# Patient Record
Sex: Male | Born: 1990 | Hispanic: Yes | Marital: Single | Smoking: Never smoker
Health system: Southern US, Community
[De-identification: ages and names within clinical notes are randomized; demographics above are authoritative.]

---

## 2020-08-31 ENCOUNTER — Inpatient Hospital Stay (HOSPITAL_BASED_OUTPATIENT_CLINIC_OR_DEPARTMENT_OTHER)
Admission: EM | Admit: 2020-08-31 | Discharge: 2020-09-02 | DRG: 155 | Disposition: A | Discharge status: Home / Self Care | Payer: No Typology Code available for payment source | Attending: Internal Medicine | Admitting: Internal Medicine

## 2020-08-31 ENCOUNTER — Other Ambulatory Visit: Payer: Self-pay

## 2020-08-31 ENCOUNTER — Emergency Department (HOSPITAL_BASED_OUTPATIENT_CLINIC_OR_DEPARTMENT_OTHER): Payer: No Typology Code available for payment source

## 2020-08-31 ENCOUNTER — Encounter (HOSPITAL_BASED_OUTPATIENT_CLINIC_OR_DEPARTMENT_OTHER): Payer: Self-pay

## 2020-08-31 DIAGNOSIS — R739 Hyperglycemia, unspecified: Secondary | ICD-10-CM | POA: Diagnosis present

## 2020-08-31 DIAGNOSIS — J384 Edema of larynx: Secondary | ICD-10-CM | POA: Diagnosis present

## 2020-08-31 DIAGNOSIS — Z20822 Contact with and (suspected) exposure to covid-19: Secondary | ICD-10-CM | POA: Diagnosis not present

## 2020-08-31 DIAGNOSIS — K7460 Unspecified cirrhosis of liver: Secondary | ICD-10-CM | POA: Diagnosis not present

## 2020-08-31 DIAGNOSIS — E872 Acidosis: Secondary | ICD-10-CM | POA: Diagnosis present

## 2020-08-31 DIAGNOSIS — N179 Acute kidney failure, unspecified: Secondary | ICD-10-CM | POA: Diagnosis present

## 2020-08-31 DIAGNOSIS — R7989 Other specified abnormal findings of blood chemistry: Secondary | ICD-10-CM

## 2020-08-31 DIAGNOSIS — F17210 Nicotine dependence, cigarettes, uncomplicated: Secondary | ICD-10-CM | POA: Diagnosis not present

## 2020-08-31 LAB — COMPREHENSIVE METABOLIC PANEL
ALT: 193 U/L — ABNORMAL HIGH (ref 0–44)
ALT: 143 U/L — ABNORMAL HIGH (ref 0–44)
AST: 116 U/L — ABNORMAL HIGH (ref 15–41)
AST: 74 U/L — ABNORMAL HIGH (ref 15–41)
Albumin: 5.7 g/dL — ABNORMAL HIGH (ref 3.5–5.0)
Albumin: 4.7 g/dL (ref 3.5–5.0)
Alkaline Phosphatase: 92 U/L (ref 38–126)
Alkaline Phosphatase: 71 U/L (ref 38–126)
Anion gap: 27 — ABNORMAL HIGH (ref 5–15)
Anion gap: 14 (ref 5–15)
BUN: 26 mg/dL — ABNORMAL HIGH (ref 6–20)
BUN: 21 mg/dL — ABNORMAL HIGH (ref 6–20)
CO2: 17 mmol/L — ABNORMAL LOW (ref 22–32)
CO2: 27 mmol/L (ref 22–32)
Calcium: 10.2 mg/dL (ref 8.9–10.3)
Calcium: 9.2 mg/dL (ref 8.9–10.3)
Chloride: 92 mmol/L — ABNORMAL LOW (ref 98–111)
Chloride: 97 mmol/L — ABNORMAL LOW (ref 98–111)
Creatinine, Ser: 1.26 mg/dL — ABNORMAL HIGH (ref 0.61–1.24)
Creatinine, Ser: 0.97 mg/dL (ref 0.61–1.24)
GFR, Estimated: >60 mL/min (ref >60)
GFR, Estimated: >60 mL/min (ref >60)
Glucose, Bld: 152 mg/dL — ABNORMAL HIGH (ref 70–99)
Glucose, Bld: 105 mg/dL — ABNORMAL HIGH (ref 70–99)
Potassium: 3.8 mmol/L (ref 3.5–5.1)
Potassium: 4.3 mmol/L (ref 3.5–5.1)
Sodium: 138 mmol/L (ref 135–145)
Sodium: 138 mmol/L (ref 135–145)
Total Bilirubin: 1.7 mg/dL — ABNORMAL HIGH (ref 0.3–1.2)
Total Bilirubin: 1.2 mg/dL (ref 0.3–1.2)
Total Protein: 9.2 g/dL — ABNORMAL HIGH (ref 6.5–8.1)
Total Protein: 7.6 g/dL (ref 6.5–8.1)

## 2020-08-31 LAB — I-STAT VENOUS BLOOD GAS, ED
Acid-Base Excess: 2 mmol/L (ref 0.0–2.0)
Bicarbonate: 26.2 mmol/L (ref 20.0–28.0)
Calcium, Ion: 1.17 mmol/L (ref 1.15–1.40)
HCT: 46 % (ref 39.0–52.0)
Hemoglobin: 15.6 g/dL (ref 13.0–17.0)
O2 Saturation: 81 %
Patient temperature: 98.6
Potassium: 5.1 mmol/L (ref 3.5–5.1)
Sodium: 132 mmol/L — ABNORMAL LOW (ref 135–145)
TCO2: 27 mmol/L (ref 22–32)
pCO2, Ven: 37.6 mmHg — ABNORMAL LOW (ref 44.0–60.0)
pH, Ven: 7.451 — ABNORMAL HIGH (ref 7.250–7.430)
pO2, Ven: 43 mmHg (ref 32.0–45.0)

## 2020-08-31 LAB — BETA-HYDROXYBUTYRIC ACID: Beta-Hydroxybutyric Acid: 2.64 mmol/L — ABNORMAL HIGH (ref 0.05–0.27)

## 2020-08-31 LAB — URINALYSIS, ROUTINE W REFLEX MICROSCOPIC
Bilirubin Urine: NEGATIVE
Glucose, UA: NEGATIVE mg/dL
Hgb urine dipstick: NEGATIVE
Ketones, ur: >80 mg/dL — AB
Leukocytes,Ua: NEGATIVE
Nitrite: NEGATIVE
Protein, ur: 100 mg/dL — AB
Specific Gravity, Urine: 1.03 (ref 1.005–1.030)
pH: 5.5 (ref 5.0–8.0)

## 2020-08-31 LAB — CBC
HCT: 49.9 % (ref 39.0–52.0)
Hemoglobin: 17.5 g/dL — ABNORMAL HIGH (ref 13.0–17.0)
MCH: 32.5 pg (ref 26.0–34.0)
MCHC: 35.1 g/dL (ref 30.0–36.0)
MCV: 92.8 fL (ref 80.0–100.0)
Platelets: 378 10*3/uL (ref 150–400)
RBC: 5.38 MIL/uL (ref 4.22–5.81)
RDW: 13.8 % (ref 11.5–15.5)
WBC: 16.1 10*3/uL — ABNORMAL HIGH (ref 4.0–10.5)
nRBC: 0 % (ref 0.0–0.2)

## 2020-08-31 LAB — RAPID URINE DRUG SCREEN, HOSP PERFORMED
Amphetamines: POSITIVE — AB
Barbiturates: NOT DETECTED
Benzodiazepines: NOT DETECTED
Cocaine: NOT DETECTED
Opiates: POSITIVE — AB
Tetrahydrocannabinol: POSITIVE — AB

## 2020-08-31 LAB — BASIC METABOLIC PANEL
Anion gap: 18 — ABNORMAL HIGH (ref 5–15)
Anion gap: 13 (ref 5–15)
BUN: 25 mg/dL — ABNORMAL HIGH (ref 6–20)
BUN: 15 mg/dL (ref 6–20)
CO2: 20 mmol/L — ABNORMAL LOW (ref 22–32)
CO2: 24 mmol/L (ref 22–32)
Calcium: 9.3 mg/dL (ref 8.9–10.3)
Calcium: 9.5 mg/dL (ref 8.9–10.3)
Chloride: 95 mmol/L — ABNORMAL LOW (ref 98–111)
Chloride: 99 mmol/L (ref 98–111)
Creatinine, Ser: 1.14 mg/dL (ref 0.61–1.24)
Creatinine, Ser: 1.07 mg/dL (ref 0.61–1.24)
GFR, Estimated: >60 mL/min (ref >60)
GFR, Estimated: >60 mL/min (ref >60)
Glucose, Bld: 266 mg/dL — ABNORMAL HIGH (ref 70–99)
Glucose, Bld: 127 mg/dL — ABNORMAL HIGH (ref 70–99)
Potassium: 5.4 mmol/L — ABNORMAL HIGH (ref 3.5–5.1)
Potassium: 4.3 mmol/L (ref 3.5–5.1)
Sodium: 133 mmol/L — ABNORMAL LOW (ref 135–145)
Sodium: 136 mmol/L (ref 135–145)

## 2020-08-31 LAB — HEMOGLOBIN A1C
Hgb A1c MFr Bld: 5.1 % (ref 4.8–5.6)
Mean Plasma Glucose: 99.67 mg/dL

## 2020-08-31 LAB — HIV ANTIBODY (ROUTINE TESTING W REFLEX): HIV Screen 4th Generation wRfx: NONREACTIVE

## 2020-08-31 LAB — CK: Total CK: 243 U/L (ref 49–397)

## 2020-08-31 LAB — RESP PANEL BY RT-PCR (FLU A&B, COVID) ARPGX2
Influenza A by PCR: NEGATIVE
Influenza B by PCR: NEGATIVE
SARS Coronavirus 2 by RT PCR: NEGATIVE

## 2020-08-31 LAB — SALICYLATE LEVEL: Salicylate Lvl: <7 mg/dL — ABNORMAL LOW (ref 7.0–30.0)

## 2020-08-31 LAB — LACTIC ACID, PLASMA: Lactic Acid, Venous: 1 mmol/L (ref 0.5–1.9)

## 2020-08-31 LAB — LIPASE, BLOOD: Lipase: <10 U/L — ABNORMAL LOW (ref 11–51)

## 2020-08-31 MED ORDER — ONDANSETRON HCL 4 MG/2ML IJ SOLN
4.0000 mg | Freq: Once | INTRAMUSCULAR | Status: AC
  Administered 2020-08-31: 4 mg via INTRAVENOUS
  Filled 2020-08-31: qty 2

## 2020-08-31 MED ORDER — SUCRALFATE 1 GM/10ML PO SUSP
1.0000 g | Freq: Once | ORAL | Status: AC
  Administered 2020-08-31: 1 g via ORAL
  Filled 2020-08-31: qty 10

## 2020-08-31 MED ORDER — DEXTROSE IN LACTATED RINGERS 5 % IV SOLN: INTRAVENOUS | Status: DC

## 2020-08-31 MED ORDER — LIDOCAINE VISCOUS HCL 2 % MT SOLN
15.0000 mL | Freq: Once | OROMUCOSAL | Status: AC
  Administered 2020-08-31: 15 mL via OROMUCOSAL
  Filled 2020-08-31: qty 15

## 2020-08-31 MED ORDER — LACTATED RINGERS IV BOLUS
1000.0000 mL | Freq: Once | INTRAVENOUS | Status: AC
  Administered 2020-08-31: 1000 mL via INTRAVENOUS

## 2020-08-31 MED ORDER — DEXAMETHASONE SODIUM PHOSPHATE 10 MG/ML IJ SOLN
16.0000 mg | Freq: Once | INTRAMUSCULAR | Status: AC
  Administered 2020-08-31 (×2): 16 mg via INTRAVENOUS
  Filled 2020-08-31: qty 2

## 2020-08-31 MED ORDER — FAMOTIDINE IN NACL 20-0.9 MG/50ML-% IV SOLN
20.0000 mg | Freq: Once | INTRAVENOUS | Status: AC
  Administered 2020-08-31: 20 mg via INTRAVENOUS
  Filled 2020-08-31: qty 50

## 2020-08-31 MED ORDER — LACTATED RINGERS IV BOLUS: 2000.0000 mL | Freq: Once | INTRAVENOUS | Status: DC

## 2020-08-31 MED ORDER — LACTATED RINGERS IV SOLN: INTRAVENOUS | Status: AC

## 2020-08-31 MED ORDER — EPINEPHRINE 0.3 MG/0.3ML IJ SOAJ
0.3000 mg | Freq: Once | INTRAMUSCULAR | Status: AC
  Administered 2020-08-31: 0.3 mg via INTRAMUSCULAR
  Filled 2020-08-31: qty 0.3

## 2020-08-31 MED ORDER — MORPHINE SULFATE (PF) 4 MG/ML IV SOLN
4.0000 mg | Freq: Once | INTRAVENOUS | Status: AC
  Administered 2020-08-31: 4 mg via INTRAVENOUS
  Filled 2020-08-31: qty 1

## 2020-08-31 NOTE — ED Notes (Signed)
Patient transported to CT 

## 2020-08-31 NOTE — ED Triage Notes (Signed)
He reports vomiting several times while at work yesterday. 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 He tells me he has vomited about twenty times during the night last night. He is in no distress.

## 2020-08-31 NOTE — H&P (Signed)
History and Physical    Eugene Swanson YWV:[id number] DOB: 10-01-1990 DOA: 08/31/2020  PCP: Eartha Inch, MD  Patient coming from: Home.  Chief Complaint: Nausea vomiting and throat pain.  HPI: Eugene Swanson is a 30 y.o. male with no significant past medical history was brought to the ER after patient had multiple episodes of nausea vomiting and throat pain.  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  He felt place was very hard and he started perspiring and eventually started having multiple episodes of vomiting at least 3 episodes.  Since he was not feeling well he was allowed to go early.  After reaching home he was feeling fine but later in the night he started having multiple episodes throwing up eventually some blood-tinged.  He threw up overnight until morning 5:00.  At this point he decided come to the ER.  His throat started hurting and he also had having pain when he swallows anything.  Denies any fever chills chest pain shortness of breath abdominal pain.  Denies taking any medications.  ED Course: In the ER patient was afebrile.  Labs were significant for elevated LFTs with AST of 116 ALT of 193 total bilirubin of 1.7 creatinine 1.2 anion gap of 27 with bicarb of 17 blood glucose 152 hemoglobin A1c was 5.1 urine shows a lot of ketones CBC is showing WBC of 16.1 lactic acid was normal salicylates were negative patient's anion gap was corrected after fluids.  Given the throat pain patient had CT soft neck tissue done which shows bilateral laryngeal edema with esophagitis.  Patient was given steroids Pepcid admitted for further observation.  COVID test was negative.  Review of Systems: As per HPI, rest all negative.   Past medical history -nothing significant.  Past surgical history -none.  Social history -smokes cigarettes and drinks alcohol occasionally.  Denies using drugs.  No  Known Allergies  Family history -denies any diabetes in the family.  Prior to Admission medications   Not on File    Physical Exam: Constitutional: Moderately built and nourished. Vitals:   08/31/20 1633 08/31/20 1700 08/31/20 1800 08/31/20 2128  BP: (!) 169/94 (!) 154/104 (!) 143/93 (!) 154/88  Pulse: 94 64 89 81  Resp: 16 11 13 20   Temp:    97.9 F (36.6 C)  TempSrc:    Oral  SpO2: 100% 100% 100% 100%   Eyes: Anicteric no pallor. ENMT: No discharge from the ears eyes nose or mouth. Neck: No stridor. Respiratory: No rhonchi or crepitations. Cardiovascular: S1-S2 heard. Abdomen: Soft nontender bowel sounds present. Musculoskeletal: No edema. Skin: No rash. Neurologic: Alert awake oriented time place and person.  Moves all extremities. Psychiatric: Appears normal.  Normal affect.   Labs on Admission: I have personally reviewed following labs and imaging studies  CBC: Recent Labs  Lab 08/31/20 0927 08/31/20 1246  WBC 16.1*  --   HGB 17.5* 15.6  HCT 49.9 46.0  MCV 92.8  --   PLT 378  --    Basic Metabolic Panel: Recent Labs  Lab 08/31/20 0927 08/31/20 1246 08/31/20 1254 08/31/20 1545  NA 138 132* 133* 138  K 3.8 5.1 5.4* 4.3  CL 92*  --  95* 97*  CO2 17*  --  20* 27  GLUCOSE 152*  --  266* 105*  BUN 26*  --  25* 21*  CREATININE 1.26*  --  1.14 0.97  CALCIUM 10.2  --  9.3 9.2   GFR: CrCl cannot be calculated (Unknown ideal weight.). Liver Function Tests: Recent Labs  Lab 08/31/20 0927 08/31/20 1545  AST 116* 74*  ALT 193* 143*  ALKPHOS 92 71  BILITOT 1.7* 1.2  PROT 9.2* 7.6  ALBUMIN 5.7* 4.7   Recent Labs  Lab 08/31/20 0927  LIPASE <10*   No results for input(s): AMMONIA in the last 168 hours. Coagulation Profile: No results for input(s): INR, PROTIME in the last 168 hours. Cardiac Enzymes: Recent Labs  Lab 08/31/20 1417  CKTOTAL 243   BNP (last 3 results) No results for input(s): PROBNP in the last 8760 hours. HbA1C: No results  for input(s): HGBA1C in the last 72 hours. CBG: No results for input(s): GLUCAP in the last 168 hours. Lipid Profile: No results for input(s): CHOL, HDL, LDLCALC, TRIG, CHOLHDL, LDLDIRECT in the last 72 hours. Thyroid Function Tests: No results for input(s): TSH, T4TOTAL, FREET4, T3FREE, THYROIDAB in the last 72 hours. Anemia Panel: No results for input(s): VITAMINB12, FOLATE, FERRITIN, TIBC, IRON, RETICCTPCT in the last 72 hours. Urine analysis:    Component Value Date/Time   COLORURINE YELLOW 08/31/2020 1022   APPEARANCEUR CLEAR 08/31/2020 1022   LABSPEC 1.030 08/31/2020 1022   PHURINE 5.5 08/31/2020 1022   GLUCOSEU NEGATIVE 08/31/2020 1022   HGBUR NEGATIVE 08/31/2020 1022   BILIRUBINUR NEGATIVE 08/31/2020 1022   KETONESUR >80 (A) 08/31/2020 1022   PROTEINUR 100 (A) 08/31/2020 1022   NITRITE NEGATIVE 08/31/2020 1022   LEUKOCYTESUR NEGATIVE 08/31/2020 1022   Sepsis Labs: @LABRCNTIP (procalcitonin:4,lacticidven:4) ) Recent Results (from the past 240 hour(s))  Resp Panel by RT-PCR (Flu A&B, Covid) Nasopharyngeal Swab     Status: None   Collection Time: 08/31/20  7:26 PM   Specimen: Nasopharyngeal Swab; Nasopharyngeal(NP) swabs in vial transport medium  Result Value Ref Range Status   SARS Coronavirus 2 by RT PCR NEGATIVE NEGATIVE Final    Comment: (NOTE) SARS-CoV-2 target nucleic acids are NOT DETECTED.  The SARS-CoV-2 RNA is generally detectable in upper respiratory specimens during the acute phase of infection. The lowest concentration of SARS-CoV-2 viral copies this assay can detect is 138 copies/mL. A negative result does not preclude SARS-Cov-2 infection and should not be used as the sole basis for treatment or other patient management decisions. A negative result may occur with  improper specimen collection/handling, submission of specimen other than nasopharyngeal swab, presence of viral mutation(s) within the areas targeted by this assay, and inadequate number of  viral copies(<138 copies/mL). A negative result must be combined with clinical observations, patient history, and epidemiological information. The expected result is Negative.  Fact Sheet for Patients:  09/02/20  Fact Sheet for Healthcare Providers:  BloggerCourse.com  This test is no t yet approved or cleared by the [place] FDA and  has been authorized for detection and/or diagnosis of SARS-CoV-2 by FDA under an Emergency Use Authorization (EUA). This EUA will remain  in effect (meaning this test can be used) for the duration of the COVID-19 declaration under Section 564(b)(1) of the Act, 21 U.S.C.section 360bbb-3(b)(1), unless the authorization is terminated  or revoked sooner.       Influenza A by PCR NEGATIVE NEGATIVE Final   Influenza B by PCR NEGATIVE NEGATIVE Final    Comment: (NOTE) The Xpert Xpress SARS-CoV-2/FLU/RSV plus assay is intended as an aid in the diagnosis of influenza from Nasopharyngeal swab specimens and should not be used as a sole basis for treatment. Nasal washings and  aspirates are unacceptable for Xpert Xpress SARS-CoV-2/FLU/RSV testing.  Fact Sheet for Patients: BloggerCourse.com  Fact Sheet for Healthcare Providers: SeriousBroker.it  This test is not yet approved or cleared by the [place] FDA and has been authorized for detection and/or diagnosis of SARS-CoV-2 by FDA under an Emergency Use Authorization (EUA). This EUA will remain in effect (meaning this test can be used) for the duration of the COVID-19 declaration under Section 564(b)(1) of the Act, 21 U.S.C. section 360bbb-3(b)(1), unless the authorization is terminated or revoked.  Performed at [facility], [address], [place], [place] [address]      Radiological Exams on Admission: CT Soft Tissue Neck Wo Contrast  Result Date:  08/31/2020 CLINICAL DATA:  Dysphagia.  Propane, vomiting, EXAM: CT NECK WITHOUT CONTRAST TECHNIQUE: Multidetector CT imaging of the neck was performed following the standard protocol without intravenous contrast. COMPARISON:  None. FINDINGS: Pharynx and larynx: Oropharynx and nasopharynx normal. There is symmetric edema of the larynx bilaterally. This may be causing airway narrowing. No mass lesion is seen. The proximal esophagus is partially visualized and appears thickened. Salivary glands: No inflammation, mass, or stone. Thyroid: Negative Lymph nodes: No enlarged lymph nodes in the neck. Vascular: Limited vascular evaluation without intravenous contrast. Limited intracranial: Negative Visualized orbits: Negative Mastoids and visualized paranasal sinuses: Paranasal sinuses clear. Mastoid clear bilaterally. Skeleton: Negative Upper chest: Lung apices clear bilaterally. Other: None IMPRESSION: Bilateral laryngeal edema. Question anaphylaxis or allergic reaction. Evaluate for airway clinically. Question thickening of the esophagus which could represent esophagitis. These results were called by telephone at the time of interpretation on 08/31/2020 at 4:19 pm to provider Schlossman, who verbally acknowledged these results. Electronically Signed   By: Marlan Palau M.D.   On: 08/31/2020 16:20      Assessment/Plan Principal Problem:   Laryngeal edema    1. Bilateral laryngeal edema cause not clear.  Concerning for either allergic reaction or anaphylaxis.  At this time we will continue with Solu-Medrol and Pepcid.  Keep n.p.o. consult ENT. 2. Possible esophagitis seen in the CT scan.  On Pepcid at this time.  NPO. 3. Intractable nausea vomiting cause not clear since patient has elevated LFTs will check acute hepatitis panel.  Abdomen appears benign.  Check urine drug screen.  We will keep patient n.p.o. for now and on IV Pepcid for now. 4. High anion gap metabolic acidosis likely from intractable vomiting  which has improved with fluids.  We will continue to monitor metabolic panel. 5. Elevated LFTs cause not clear.  Will follow LFTs check INR check acute hepatitis panel.  Tylenol level.   DVT prophylaxis: SCDs.  Avoiding anticoagulation in anticipation of possible procedure. Code Status: Full code. Family Communication: Discussed with patient. Disposition Plan: Home. Consults called: None. Admission status: Observation.   Eduard Clos MD [facility] Pager [phone].  If 7PM-7AM, please contact night-coverage [email or web address] Password [facility]  08/31/2020, 10:09 PM

## 2020-08-31 NOTE — ED Provider Notes (Signed)
  Physical Exam  BP (!) 143/93   Pulse 89   Temp 98 F (36.7 C) (Oral)   Resp 13   SpO2 100%   Physical Exam  ED Course/Procedures     Procedures  MDM  Received care of patient from Dr. Rhunette Croft.  Please see his note for prior history, physical and care.  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  No continued severe vomiting.  On initial presentation to the emergency department he had an anion gap metabolic acidosis, which is suspected to be secondary to starvation ketoacidosis.  On recheck it was improving, however he was noted to have hyperglycemia.  Considered in the differential new onset diabetes, but suspect more likely starvation ketosis.  He had severe throat pain after vomiting, and had a noncontrast neck CT which was performed to evaluate for other causes of sore throat.  CT does not show signs of abscess, subcu air, but is significant for laryngeal edema.  He does not have stridor or signs of airway compromise on exam.  Given concern for this airway edema plus nausea and vomiting, considered atypical type of presentation of anaphylaxis.  Given 0.3 mg of epi and gave Decadron.  Reports mild improvement but continued symptoms following this.  Will admit to the hospital for continued observation.  Possible that the laryngeal edema is due to chemical exposure at work, or inflammation related to straining from vomiting.       Alvira Monday, MD 09/02/20 [phone]

## 2020-08-31 NOTE — ED Provider Notes (Signed)
[facility] EMERGENCY DEPT Provider Note   CSN: [id number] Arrival date & time: 08/31/20  0865     History Chief Complaint  Patient presents with  . Emesis    Eugene Swanson is a 30 y.o. male.  HPI     30 year old male comes in with chief complaint of vomiting.  He has no medical history and denies any heavy alcohol use, smoking, drug use.  Patient reports that he went to work yesterday.  His work requires him to be in a hot environment, fully suited.  Halfway through the work he started having nausea.  He threw up while at work, return to the work after cooling off but started feeling same way again.  He decided to go home earlier and at home he has thrown up at least 20 times overnight.  He is having severe pain with swallowing.  He is also reporting blood-tinged vomitus at the end.  He does not have any abdominal pain.   No past medical history on file.  There are no problems to display for this patient.       No family history on file.  Social History   Tobacco Use  . Smoking status: Never Smoker  . Smokeless tobacco: Never Used    Home Medications Prior to Admission medications   Not on File    Allergies    Patient has no known allergies.  Review of Systems   Review of Systems  Constitutional: Positive for activity change.  Gastrointestinal: Positive for nausea and vomiting.  All other systems reviewed and are negative.   Physical Exam Updated Vital Signs BP (!) 174/102 (BP Location: Left Arm)   Pulse (!) 111   Temp 98 F (36.7 C) (Oral)   Resp 12   SpO2 100%   Physical Exam Vitals and nursing note reviewed.  Constitutional:      Appearance: He is well-developed.  HENT:     Head: Atraumatic.     Mouth/Throat:     Mouth: Mucous membranes are dry.  Cardiovascular:     Rate and Rhythm: Tachycardia present.  Pulmonary:     Effort: Pulmonary effort is normal.  Musculoskeletal:     Cervical back: Neck supple.  Skin:     General: Skin is warm.  Neurological:     Mental Status: He is alert and oriented to person, place, and time.     ED Results / Procedures / Treatments   Labs (all labs ordered are listed, but only abnormal results are displayed) Labs Reviewed  LIPASE, BLOOD - Abnormal; Notable for the following components:      Result Value   Lipase <10 (*)    All other components within normal limits  COMPREHENSIVE METABOLIC PANEL - Abnormal; Notable for the following components:   Chloride 92 (*)    CO2 17 (*)    Glucose, Bld 152 (*)    BUN 26 (*)    Creatinine, Ser 1.26 (*)    Total Protein 9.2 (*)    Albumin 5.7 (*)    AST 116 (*)    ALT 193 (*)    Total Bilirubin 1.7 (*)    Anion gap 27 (*)    All other components within normal limits  CBC - Abnormal; Notable for the following components:   WBC 16.1 (*)    Hemoglobin 17.5 (*)    All other components within normal limits  URINALYSIS, ROUTINE W REFLEX MICROSCOPIC - Abnormal; Notable for the following components:   Ketones,  ur >80 (*)    Protein, ur 100 (*)    All other components within normal limits  I-STAT VENOUS BLOOD GAS, ED - Abnormal; Notable for the following components:   pH, Ven 7.451 (*)    pCO2, Ven 37.6 (*)    Sodium 132 (*)    All other components within normal limits  BASIC METABOLIC PANEL    EKG None  Radiology No results found.  Procedures .Critical Care Performed by: Derwood Kaplan, MD Authorized by: Derwood Kaplan, MD   Critical care provider statement:    Critical care time (minutes):  50   Critical care was necessary to treat or prevent imminent or life-threatening deterioration of the following conditions:  Metabolic crisis and dehydration   Critical care was time spent personally by me on the following activities:  Discussions with consultants, evaluation of patient's response to treatment, examination of patient, ordering and performing treatments and interventions, ordering and review of  laboratory studies, ordering and review of radiographic studies, pulse oximetry, re-evaluation of patient's condition, obtaining history from patient or surrogate and review of old charts     Medications Ordered in ED Medications  dextrose 5 % in lactated ringers infusion ( Intravenous New Bag/Given 08/31/20 1107)  ondansetron (ZOFRAN) injection 4 mg (4 mg Intravenous Given 08/31/20 1115)  lactated ringers bolus 1,000 mL (1,000 mLs Intravenous New Bag/Given 08/31/20 1244)  sucralfate (CARAFATE) 1 GM/10ML suspension 1 g (1 g Oral Given 08/31/20 1115)  lidocaine (XYLOCAINE) 2 % viscous mouth solution 15 mL (15 mLs Mouth/Throat Given 08/31/20 1257)    ED Course  I have reviewed the triage vital signs and the nursing notes.  Pertinent labs & imaging results that were available during my care of the patient were reviewed by me and considered in my medical decision making (see chart for details).    MDM Rules/Calculators/A&P                          30 year old male comes in with chief complaint of nausea, vomiting, dizziness.  He is also complaining of throat pain.  Initial thought was that patient might have had heat exhaustion and dehydration. Basic labs ordered, it reveals severe anion gap of 27 slight elevation of creatinine.  Patient was given 1 L of LR and 1 L of D5LR -with the thought that he might have starvation ketosis along with dehydration.  Repeat labs revealed improved anion gap.  Patient still has a mild anion gap and he has now elevated sugar.  With that lab, the thought was that maybe patient could have new onset diabetes.  He is not able to provide any clear history whether he is having polyuria, polyphagia, polydipsia.  Plan is to give another liter of LR and reassess his CMP, get beta hydroxybutyrate acid, and continue with oral challenge.  So far patient has received GI cocktail, IV morphine and he still has severe pain in his throat.  It still makes it difficult for him to  swallow.  At 330, patient's care will be signed out to incoming team.  If patient's repeat labs are not significantly better, then he will need admission to the hospital for evaluation of diabetes and treatment for his ketosis.  If patient labs are stable or improving, but he is unable to swallow and passed oral challenge and he will be admitted for his symptoms to ensure that he does not get worse.  Given his persistent pain, I will get  CT soft tissue neck to ensure there is no internal injury that can be appreciated.  Final Clinical Impression(s) / ED Diagnoses Final diagnoses:  None    Rx / DC Orders ED Discharge Orders    None       Derwood Kaplan, MD 08/31/20 1535

## 2020-09-01 DIAGNOSIS — F17210 Nicotine dependence, cigarettes, uncomplicated: Secondary | ICD-10-CM | POA: Diagnosis present

## 2020-09-01 DIAGNOSIS — Z20822 Contact with and (suspected) exposure to covid-19: Secondary | ICD-10-CM | POA: Diagnosis present

## 2020-09-01 DIAGNOSIS — E872 Acidosis: Secondary | ICD-10-CM | POA: Diagnosis present

## 2020-09-01 DIAGNOSIS — K7460 Unspecified cirrhosis of liver: Secondary | ICD-10-CM | POA: Diagnosis present

## 2020-09-01 DIAGNOSIS — N179 Acute kidney failure, unspecified: Secondary | ICD-10-CM | POA: Diagnosis present

## 2020-09-01 DIAGNOSIS — R739 Hyperglycemia, unspecified: Secondary | ICD-10-CM | POA: Diagnosis present

## 2020-09-01 DIAGNOSIS — R7989 Other specified abnormal findings of blood chemistry: Secondary | ICD-10-CM

## 2020-09-01 DIAGNOSIS — J384 Edema of larynx: Secondary | ICD-10-CM | POA: Diagnosis present

## 2020-09-01 LAB — HEPATIC FUNCTION PANEL
ALT: 126 U/L — ABNORMAL HIGH (ref 0–44)
AST: 64 U/L — ABNORMAL HIGH (ref 15–41)
Albumin: 3.8 g/dL (ref 3.5–5.0)
Alkaline Phosphatase: 71 U/L (ref 38–126)
Bilirubin, Direct: 0.3 mg/dL — ABNORMAL HIGH (ref 0.0–0.2)
Indirect Bilirubin: 1.4 mg/dL — ABNORMAL HIGH (ref 0.3–0.9)
Total Bilirubin: 1.7 mg/dL — ABNORMAL HIGH (ref 0.3–1.2)
Total Protein: 6.9 g/dL (ref 6.5–8.1)

## 2020-09-01 LAB — BASIC METABOLIC PANEL
Anion gap: 15 (ref 5–15)
BUN: 16 mg/dL (ref 6–20)
CO2: 24 mmol/L (ref 22–32)
Calcium: 9.8 mg/dL (ref 8.9–10.3)
Chloride: 98 mmol/L (ref 98–111)
Creatinine, Ser: 0.98 mg/dL (ref 0.61–1.24)
GFR, Estimated: >60 mL/min (ref >60)
Glucose, Bld: 89 mg/dL (ref 70–99)
Potassium: 4.4 mmol/L (ref 3.5–5.1)
Sodium: 137 mmol/L (ref 135–145)

## 2020-09-01 LAB — CBC
HCT: 44.8 % (ref 39.0–52.0)
Hemoglobin: 15 g/dL (ref 13.0–17.0)
MCH: 32.5 pg (ref 26.0–34.0)
MCHC: 33.5 g/dL (ref 30.0–36.0)
MCV: 97 fL (ref 80.0–100.0)
Platelets: 308 10*3/uL (ref 150–400)
RBC: 4.62 MIL/uL (ref 4.22–5.81)
RDW: 13.9 % (ref 11.5–15.5)
WBC: 14.6 10*3/uL — ABNORMAL HIGH (ref 4.0–10.5)
nRBC: 0 % (ref 0.0–0.2)

## 2020-09-01 LAB — HEPATITIS PANEL, ACUTE
HCV Ab: NONREACTIVE
Hep A IgM: NONREACTIVE
Hep B C IgM: NONREACTIVE
Hepatitis B Surface Ag: NONREACTIVE

## 2020-09-01 LAB — PROTIME-INR
INR: 1 (ref 0.8–1.2)
Prothrombin Time: 13.1 seconds (ref 11.4–15.2)

## 2020-09-01 LAB — SARS CORONAVIRUS 2 (TAT 6-24 HRS): SARS Coronavirus 2: NEGATIVE

## 2020-09-01 LAB — ACETAMINOPHEN LEVEL: Acetaminophen (Tylenol), Serum: <10 ug/mL — ABNORMAL LOW (ref 10–30)

## 2020-09-01 LAB — TROPONIN I (HIGH SENSITIVITY): Troponin I (High Sensitivity): 4 ng/L (ref <18)

## 2020-09-01 MED ORDER — LIDOCAINE VISCOUS HCL 2 % MT SOLN
15.0000 mL | Freq: Once | OROMUCOSAL | Status: DC
  Filled 2020-09-01: qty 15

## 2020-09-01 MED ORDER — ALUM & MAG HYDROXIDE-SIMETH 200-200-20 MG/5ML PO SUSP
30.0000 mL | Freq: Once | ORAL | Status: DC
  Filled 2020-09-01: qty 30

## 2020-09-01 MED ORDER — PHENOL 1.4 % MT LIQD
1.0000 | OROMUCOSAL | Status: DC | PRN
  Administered 2020-09-02: 1 via OROMUCOSAL
  Filled 2020-09-01: qty 177

## 2020-09-01 MED ORDER — METHYLPREDNISOLONE SODIUM SUCC 40 MG IJ SOLR
40.0000 mg | Freq: Two times a day (BID) | INTRAMUSCULAR | Status: DC
  Administered 2020-09-01 – 2020-09-02 (×4): 40 mg via INTRAVENOUS
  Filled 2020-09-01 (×4): qty 1

## 2020-09-01 MED ORDER — FAMOTIDINE IN NACL 20-0.9 MG/50ML-% IV SOLN
20.0000 mg | Freq: Two times a day (BID) | INTRAVENOUS | Status: DC
  Administered 2020-09-01 – 2020-09-02 (×4): 20 mg via INTRAVENOUS
  Filled 2020-09-01 (×5): qty 50

## 2020-09-01 NOTE — Progress Notes (Signed)
PROGRESS NOTE    Eugene Swanson  MWN:[id number] DOB: 1990-11-27 DOA: 08/31/2020 PCP: Chesley Noon, MD    Brief Narrative:  30 y.o. male with no significant past medical history was brought to the ER after patient had multiple episodes of nausea vomiting and throat pain. Work up was notable for concerns of laryngeal edema. Pt was admitted for further work up  Assessment & Plan:   Principal Problem:   Laryngeal edema  1. Bilateral laryngeal edema likely secondary to intractable vomiting 1. Pt is continued on Solu-Medrol and Pepcid.   2. No stridor on exam, pt is on room air 3. Discussed case with ENT on call and reviewed CT with ENT 4. OK to start clears. Cont steroids and H2 blocker. Slowly advance diet as tolerated 2. Possible esophagitis seen in the CT scan.   1. Pt is continued on pepcid per above 3. Intractable nausea vomiting 1. Unclear etiology 2. Resolved this AM 3. Will start clears and slowly advance diet as tolerated 4. High anion gap metabolic acidosis likely from intractable vomiting which has improved with fluids.   1. Will repeat bmet in AM 5. Elevated LFTs cause not clear.   1. Acute hepatitis panel neg 2. LFT's are trending down 3. Alk phos is normal 4. Will check RUQ US  DVT prophylaxis: SCD's Code Status: Full Family Communication: Pt in room, family not at bedside  Status is: Observation  The patient will require care spanning > 2 midnights and should be moved to inpatient because: Inpatient level of care appropriate due to severity of illness  Dispo: The patient is from: Home              Anticipated d/c is to: Home              Patient currently is not medically stable to d/c.   Difficult to place patient No       Consultants:   Discussed case with ENT  Procedures:     Antimicrobials: Anti-infectives (From admission, onward)   None       Subjective: Denies further nausea. Still having some pain in the throat when trying  to swallow  Objective: Vitals:   08/31/20 1700 08/31/20 1800 08/31/20 2128 09/01/20 0929  BP: (!) 154/104 (!) 143/93 (!) 154/88 (!) 135/94  Pulse: 64 89 81 75  Resp: 11 13 20 16   Temp:   97.9 F (36.6 C) 98.2 F (36.8 C)  TempSrc:   Oral Oral  SpO2: 100% 100% 100% 98%    Intake/Output Summary (Last 24 hours) at 09/01/2020 1404 Last data filed at 09/01/2020 0400 Gross per 24 hour  Intake 643.95 ml  Output --  Net 643.95 ml   There were no vitals filed for this visit.  Examination: General exam: Awake, laying in bed, in nad Respiratory system: Normal respiratory effort, no wheezing Cardiovascular system: regular rate, s1, s2 Gastrointestinal system: Soft, nondistended, positive BS Central nervous system: CN2-12 grossly intact, strength intact Extremities: Perfused, no clubbing Skin: Normal skin turgor, no notable skin lesions seen Psychiatry: Mood normal // no visual hallucinations   Data Reviewed: I have personally reviewed following labs and imaging studies  CBC: Recent Labs  Lab 08/31/20 0927 08/31/20 1246 09/01/20 0147  WBC 16.1*  --  14.6*  HGB 17.5* 15.6 15.0  HCT 49.9 46.0 44.8  MCV 92.8  --  97.0  PLT 378  --  403   Basic Metabolic Panel: Recent Labs  Lab 08/31/20 0927 08/31/20 1246  08/31/20 1254 08/31/20 1545 08/31/20 2231 09/01/20 0147  NA 138 132* 133* 138 136 137  K 3.8 5.1 5.4* 4.3 4.3 4.4  CL 92*  --  95* 97* 99 98  CO2 17*  --  20* 27 24 24   GLUCOSE 152*  --  266* 105* 127* 89  BUN 26*  --  25* 21* 15 16  CREATININE 1.26*  --  1.14 0.97 1.07 0.98  CALCIUM 10.2  --  9.3 9.2 9.5 9.8   GFR: CrCl cannot be calculated (Unknown ideal weight.). Liver Function Tests: Recent Labs  Lab 08/31/20 0927 08/31/20 1545 09/01/20 0614  AST 116* 74* 64*  ALT 193* 143* 126*  ALKPHOS 92 71 71  BILITOT 1.7* 1.2 1.7*  PROT 9.2* 7.6 6.9  ALBUMIN 5.7* 4.7 3.8   Recent Labs  Lab 08/31/20 0927  LIPASE <10*   No results for input(s): AMMONIA in the  last 168 hours. Coagulation Profile: Recent Labs  Lab 09/01/20 0614  INR 1.0   Cardiac Enzymes: Recent Labs  Lab 08/31/20 1417  CKTOTAL 243   BNP (last 3 results) No results for input(s): PROBNP in the last 8760 hours. HbA1C: Recent Labs    08/31/20 0927  HGBA1C 5.1   CBG: No results for input(s): GLUCAP in the last 168 hours. Lipid Profile: No results for input(s): CHOL, HDL, LDLCALC, TRIG, CHOLHDL, LDLDIRECT in the last 72 hours. Thyroid Function Tests: No results for input(s): TSH, T4TOTAL, FREET4, T3FREE, THYROIDAB in the last 72 hours. Anemia Panel: No results for input(s): VITAMINB12, FOLATE, FERRITIN, TIBC, IRON, RETICCTPCT in the last 72 hours. Sepsis Labs: Recent Labs  Lab 08/31/20 1545  LATICACIDVEN 1.0    Recent Results (from the past 240 hour(s))  SARS CORONAVIRUS 2 (TAT 6-24 HRS) Nasopharyngeal Nasopharyngeal Swab     Status: None   Collection Time: 08/31/20  3:47 PM   Specimen: Nasopharyngeal Swab  Result Value Ref Range Status   SARS Coronavirus 2 NEGATIVE NEGATIVE Final    Comment: (NOTE) SARS-CoV-2 target nucleic acids are NOT DETECTED.  The SARS-CoV-2 RNA is generally detectable in upper and lower respiratory specimens during the acute phase of infection. Negative results do not preclude SARS-CoV-2 infection, do not rule out co-infections with other pathogens, and should not be used as the sole basis for treatment or other patient management decisions. Negative results must be combined with clinical observations, patient history, and epidemiological information. The expected result is Negative.  Fact Sheet for Patients: SugarRoll.be  Fact Sheet for Healthcare Providers: https://www.woods-mathews.com/  This test is not yet approved or cleared by the [place] FDA and  has been authorized for detection and/or diagnosis of SARS-CoV-2 by FDA under an Emergency Use Authorization (EUA). This EUA will  remain  in effect (meaning this test can be used) for the duration of the COVID-19 declaration under Se ction 564(b)(1) of the Act, 21 U.S.C. section 360bbb-3(b)(1), unless the authorization is terminated or revoked sooner.  Performed at [facility], [place] [address] [place], [place] [address]   Resp Panel by RT-PCR (Flu A&B, Covid) Nasopharyngeal Swab     Status: None   Collection Time: 08/31/20  7:26 PM   Specimen: Nasopharyngeal Swab; Nasopharyngeal(NP) swabs in vial transport medium  Result Value Ref Range Status   SARS Coronavirus 2 by RT PCR NEGATIVE NEGATIVE Final    Comment: (NOTE) SARS-CoV-2 target nucleic acids are NOT DETECTED.  The SARS-CoV-2 RNA is generally detectable in upper respiratory specimens during the acute phase  of infection. The lowest concentration of SARS-CoV-2 viral copies this assay can detect is 138 copies/mL. A negative result does not preclude SARS-Cov-2 infection and should not be used as the sole basis for treatment or other patient management decisions. A negative result may occur with  improper specimen collection/handling, submission of specimen other than nasopharyngeal swab, presence of viral mutation(s) within the areas targeted by this assay, and inadequate number of viral copies(<138 copies/mL). A negative result must be combined with clinical observations, patient history, and epidemiological information. The expected result is Negative.  Fact Sheet for Patients:  EntrepreneurPulse.com.au  Fact Sheet for Healthcare Providers:  IncredibleEmployment.be  This test is no t yet approved or cleared by the [place] FDA and  has been authorized for detection and/or diagnosis of SARS-CoV-2 by FDA under an Emergency Use Authorization (EUA). This EUA will remain  in effect (meaning this test can be used) for the duration of the COVID-19 declaration under Section 564(b)(1) of the Act, 21 U.S.C.section  360bbb-3(b)(1), unless the authorization is terminated  or revoked sooner.       Influenza A by PCR NEGATIVE NEGATIVE Final   Influenza B by PCR NEGATIVE NEGATIVE Final    Comment: (NOTE) The Xpert Xpress SARS-CoV-2/FLU/RSV plus assay is intended as an aid in the diagnosis of influenza from Nasopharyngeal swab specimens and should not be used as a sole basis for treatment. Nasal washings and aspirates are unacceptable for Xpert Xpress SARS-CoV-2/FLU/RSV testing.  Fact Sheet for Patients: EntrepreneurPulse.com.au  Fact Sheet for Healthcare Providers: IncredibleEmployment.be  This test is not yet approved or cleared by the [place] FDA and has been authorized for detection and/or diagnosis of SARS-CoV-2 by FDA under an Emergency Use Authorization (EUA). This EUA will remain in effect (meaning this test can be used) for the duration of the COVID-19 declaration under Section 564(b)(1) of the Act, 21 U.S.C. section 360bbb-3(b)(1), unless the authorization is terminated or revoked.  Performed at [facility], [address] [place], [place] [address]      Radiology Studies: CT Soft Tissue Neck Wo Contrast  Result Date: 08/31/2020 CLINICAL DATA:  Dysphagia.  Propane, vomiting, EXAM: CT NECK WITHOUT CONTRAST TECHNIQUE: Multidetector CT imaging of the neck was performed following the standard protocol without intravenous contrast. COMPARISON:  None. FINDINGS: Pharynx and larynx: Oropharynx and nasopharynx normal. There is symmetric edema of the larynx bilaterally. This may be causing airway narrowing. No mass lesion is seen. The proximal esophagus is partially visualized and appears thickened. Salivary glands: No inflammation, mass, or stone. Thyroid: Negative Lymph nodes: No enlarged lymph nodes in the neck. Vascular: Limited vascular evaluation without intravenous contrast. Limited intracranial: Negative Visualized orbits:  Negative Mastoids and visualized paranasal sinuses: Paranasal sinuses clear. Mastoid clear bilaterally. Skeleton: Negative Upper chest: Lung apices clear bilaterally. Other: None IMPRESSION: Bilateral laryngeal edema. Question anaphylaxis or allergic reaction. Evaluate for airway clinically. Question thickening of the esophagus which could represent esophagitis. These results were called by telephone at the time of interpretation on 08/31/2020 at 4:19 pm to provider Schlossman, who verbally acknowledged these results. Electronically Signed   By: Franchot Gallo M.D.   On: 08/31/2020 16:20    Scheduled Meds: . methylPREDNISolone (SOLU-MEDROL) injection  40 mg Intravenous Q12H   Continuous Infusions: . famotidine (PEPCID) IV 20 mg (09/01/20 0936)  . lactated ringers 75 mL/hr at 08/31/20 2250     LOS: 1 day   Marylu Lund, MD [facility] Pager On Amion  If 7PM-7AM, please contact night-coverage  09/01/2020, 2:04 PM

## 2020-09-01 NOTE — Plan of Care (Signed)
  Problem: Clinical Measurements: Goal: Respiratory complications will improve Outcome: Progressing   Problem: Safety: Goal: Ability to remain free from injury will improve Outcome: Progressing   Problem: Skin Integrity: Goal: Risk for impaired skin integrity will decrease Outcome: Progressing   

## 2020-09-01 NOTE — Plan of Care (Signed)

## 2020-09-01 NOTE — Plan of Care (Signed)
  Problem: Skin Integrity: Goal: Risk for impaired skin integrity will decrease Outcome: Progressing   

## 2020-09-02 ENCOUNTER — Inpatient Hospital Stay (HOSPITAL_COMMUNITY): Payer: No Typology Code available for payment source

## 2020-09-02 LAB — COMPREHENSIVE METABOLIC PANEL
ALT: 118 U/L — ABNORMAL HIGH (ref 0–44)
AST: 80 U/L — ABNORMAL HIGH (ref 15–41)
Albumin: 3.5 g/dL (ref 3.5–5.0)
Alkaline Phosphatase: 59 U/L (ref 38–126)
Anion gap: 8 (ref 5–15)
BUN: 14 mg/dL (ref 6–20)
CO2: 26 mmol/L (ref 22–32)
Calcium: 9.1 mg/dL (ref 8.9–10.3)
Chloride: 101 mmol/L (ref 98–111)
Creatinine, Ser: 0.83 mg/dL (ref 0.61–1.24)
GFR, Estimated: >60 mL/min (ref >60)
Glucose, Bld: 137 mg/dL — ABNORMAL HIGH (ref 70–99)
Potassium: 4.2 mmol/L (ref 3.5–5.1)
Sodium: 135 mmol/L (ref 135–145)
Total Bilirubin: 0.9 mg/dL (ref 0.3–1.2)
Total Protein: 6.2 g/dL — ABNORMAL LOW (ref 6.5–8.1)

## 2020-09-02 MED ORDER — METOPROLOL TARTRATE 25 MG PO TABS
25.0000 mg | ORAL_TABLET | Freq: Two times a day (BID) | ORAL | Status: DC
  Administered 2020-09-02: 25 mg via ORAL
  Filled 2020-09-02: qty 1

## 2020-09-02 MED ORDER — METOPROLOL TARTRATE 25 MG PO TABS: 25.0000 mg | ORAL_TABLET | Freq: Two times a day (BID) | ORAL | 0 refills | Status: AC

## 2020-09-02 MED ORDER — PREDNISONE 10 MG PO TABS: ORAL_TABLET | ORAL | 0 refills | Status: AC

## 2020-09-02 MED ORDER — FAMOTIDINE 20 MG PO TABS: 20.0000 mg | ORAL_TABLET | Freq: Two times a day (BID) | ORAL | 1 refills | Status: AC

## 2020-09-02 NOTE — TOC Progression Note (Signed)
Transition of Care [facility]) - Progression Note    Patient Details  Name: Eugene Swanson MRN: [id number] Date of Birth: [birth year]  [facility] of [facility] CM/SW Contact  Beckie Busing, RN Phone Number: [phone]  09/02/2020, 12:41 PM  Clinical Narrative:    [facility] consulted for PCP needs. CM at bedside to discuss PCP with the patient. Patient has PCP Dr. Larita Fife with [facility]. Patient inquiring to know how soon he should set up appointment. CM has instructed patient to call MD today and make then aware that he is being discharged from the hospital and get appointment asap. Patient is agreeable to scheduling appt and verbalized understanding of need to follow up with PCP.          Expected Discharge Plan and Services                                                 Social Determinants of Health (SDOH) Interventions    Readmission Risk Interventions No flowsheet data found.

## 2020-09-02 NOTE — Progress Notes (Signed)
Patient discharged in stable condition via car with mom. Discharge paperwork given along with work note and explained in detail. All questions and concerns were answered accordingly. Patient verbalized understanding of all instructions/paperwork.

## 2020-09-02 NOTE — Plan of Care (Signed)

## 2020-09-02 NOTE — Discharge Summary (Signed)
Physician Discharge Summary  Eugene Swanson [id number]:[id number] DOB: [birth year] DOA: 08/31/2020  PCP: Chesley Noon, MD  Admit date: 08/31/2020 Discharge date: 09/02/2020  Admitted From: Home Disposition:  Home  Recommendations for Outpatient Follow-up:  1. Follow up with PCP in 1-2 weeks 2. Recommend referral to GI as outpatient   Discharge Condition:Stable CODE STATUS:Full Diet recommendation: Soft, advance as tolerated   Brief/Interim Summary: 30 y.o.malewithno significant past medical history was brought to the ER after patient had multiple episodes of nausea vomiting and throat pain. Work up was notable for concerns of laryngeal edema. Pt was admitted for further work up  Discharge Diagnoses:  Principal Problem:   Laryngeal edema Active Problems:   Elevated LFTs  1. Bilateral laryngeal edema likely secondary to intractable vomiting 1. Pt is continued on Solu-Medrol and Pepcid.  2. No stridor on exam, pt is on room air 3. Discussed case with ENT on call and reviewed CT with ENT 4. Tolerated advancing diet to soft diet 2. Possible esophagitis seen in the CT scan.  1. Pt is continued on pepcid per above 3. Intractable nausea vomiting 1. Unclear etiology 2. Now resolved 3. Successfully advanced diet 4. High anion gap metabolic acidosis likely from intractable vomiting which has improved with fluids.  5. Elevated LFTs with cirrhosis present on admit 1. Acute hepatitis panel neg 2. LFT's are trending down 3. Alk phos is normal 4. Reviewed RUQ [place]. Findings notable for cirrhosis. Pt reports prior hx of etoh intake, used to be heavy in the past. Recommended abstaining from etoh   Discharge Instructions   Allergies as of 09/02/2020   No Known Allergies     Medication List    STOP taking these medications   ibuprofen 200 MG tablet Commonly known as: ADVIL     TAKE these medications   famotidine 20 MG tablet Commonly known as: PEPCID Take 1 tablet (20 mg  total) by mouth 2 (two) times daily.   metoprolol tartrate 25 MG tablet Commonly known as: LOPRESSOR Take 1 tablet (25 mg total) by mouth 2 (two) times daily.   predniSONE 10 MG tablet Commonly known as: DELTASONE Take 4 tablets (40 mg total) by mouth daily for 3 days, THEN 2 tablets (20 mg total) daily for 3 days, THEN 1 tablet (10 mg total) daily for 3 days, THEN 0.5 tablets (5 mg total) daily for 2 days. Start taking on: Sep 02, 2020       Follow-up Information    Chesley Noon, MD. Schedule an appointment as soon as possible for a visit in 1 week(s).   Specialty: Family Medicine Contact information: [place] [address] [phone]              No Known Allergies  Consultations:  Discussed with ENT  Procedures/Studies: CT Soft Tissue Neck Wo Contrast  Result Date: 08/31/2020 CLINICAL DATA:  Dysphagia.  Propane, vomiting, EXAM: CT NECK WITHOUT CONTRAST TECHNIQUE: Multidetector CT imaging of the neck was performed following the standard protocol without intravenous contrast. COMPARISON:  None. FINDINGS: Pharynx and larynx: Oropharynx and nasopharynx normal. There is symmetric edema of the larynx bilaterally. This may be causing airway narrowing. No mass lesion is seen. The proximal esophagus is partially visualized and appears thickened. Salivary glands: No inflammation, mass, or stone. Thyroid: Negative Lymph nodes: No enlarged lymph nodes in the neck. Vascular: Limited vascular evaluation without intravenous contrast. Limited intracranial: Negative Visualized orbits: Negative Mastoids and visualized paranasal sinuses: Paranasal sinuses clear. Mastoid clear  bilaterally. Skeleton: Negative Upper chest: Lung apices clear bilaterally. Other: None IMPRESSION: Bilateral laryngeal edema. Question anaphylaxis or allergic reaction. Evaluate for airway clinically. Question thickening of the esophagus which could represent esophagitis. These results were called  by telephone at the time of interpretation on 08/31/2020 at 4:19 pm to provider Schlossman, who verbally acknowledged these results. Electronically Signed   By: Franchot Gallo M.D.   On: 08/31/2020 16:20   US Abdomen Limited RUQ (LIVER/GB)  Result Date: 09/02/2020 CLINICAL DATA:  Elevated liver function test. Alcohol use. Substance use. EXAM: ULTRASOUND ABDOMEN LIMITED RIGHT UPPER QUADRANT COMPARISON:  None. FINDINGS: Gallbladder: No gallstones or wall thickening visualized. No sonographic Murphy sign noted by sonographer. Common bile duct: Diameter: 3 mm. Liver: Nodular hepatic contour. No focal lesion identified. Increased in parenchymal echogenicity. Portal vein is patent on color Doppler imaging with normal direction of blood flow towards the liver. Other: None. IMPRESSION: Cirrhosis and hepatic steatosis. Please note limited evaluation for focal hepatic masses in a patient with hepatic steatosis due to decreased penetration of the acoustic ultrasound waves. Recommend MRI liver protocol for further evaluation. Electronically Signed   By: Iven Finn M.D.   On: 09/02/2020 05:14     Subjective: Eager to go home  Discharge Exam: Vitals:   09/02/20 0832 09/02/20 1300  BP: (!) 141/40 (!) 179/102  Pulse: 84   Resp: 18 17  Temp: 98.1 F (36.7 C) 97.9 F (36.6 C)  SpO2: 100% 100%   Vitals:   09/01/20 1959 09/02/20 0518 09/02/20 0832 09/02/20 1300  BP: (!) 144/97 (!) 140/96 (!) 141/40 (!) 179/102  Pulse: 73 74 84   Resp: _0 Temp: 98.2 F (36.8 C) 97.9 F (36.6 C) 98.1 F (36.7 C) 97.9 F (36.6 C)  TempSrc: Oral Oral Oral Oral  SpO2: 97% 98% 100% 100%    General: Pt is alert, awake, not in acute distress Cardiovascular: RRR, S1/S2 + Respiratory: CTA bilaterally, no wheezing, no rhonchi Abdominal: Soft, NT, ND, bowel sounds + Extremities: no edema, no cyanosis   The results of significant diagnostics from this hospitalization (including imaging, microbiology,  ancillary and laboratory) are listed below for reference.     Microbiology: Recent Results (from the past 240 hour(s))  SARS CORONAVIRUS 2 (TAT 6-24 HRS) Nasopharyngeal Nasopharyngeal Swab     Status: None   Collection Time: 08/31/20  3:47 PM   Specimen: Nasopharyngeal Swab  Result Value Ref Range Status   SARS Coronavirus 2 NEGATIVE NEGATIVE Final    Comment: (NOTE) SARS-CoV-2 target nucleic acids are NOT DETECTED.  The SARS-CoV-2 RNA is generally detectable in upper and lower respiratory specimens during the acute phase of infection. Negative results do not preclude SARS-CoV-2 infection, do not rule out co-infections with other pathogens, and should not be used as the sole basis for treatment or other patient management decisions. Negative results must be combined with clinical observations, patient history, and epidemiological information. The expected result is Negative.  Fact Sheet for Patients: SugarRoll.be  Fact Sheet for Healthcare Providers: https://www.woods-mathews.com/  This test is not yet approved or cleared by the [place] FDA and  has been authorized for detection and/or diagnosis of SARS-CoV-2 by FDA under an Emergency Use Authorization (EUA). This EUA will remain  in effect (meaning this test can be used) for the duration of the COVID-19 declaration under Se ction 564(b)(1) of the Act, 21 U.S.C. section 360bbb-3(b)(1), unless the authorization is terminated or revoked sooner.  Performed at [facility]  Lab, [address], [place], [place] [address]   Resp Panel by RT-PCR (Flu A&B, Covid) Nasopharyngeal Swab     Status: None   Collection Time: 08/31/20  7:26 PM   Specimen: Nasopharyngeal Swab; Nasopharyngeal(NP) swabs in vial transport medium  Result Value Ref Range Status   SARS Coronavirus 2 by RT PCR NEGATIVE NEGATIVE Final    Comment: (NOTE) SARS-CoV-2 target nucleic acids are NOT DETECTED.  The  SARS-CoV-2 RNA is generally detectable in upper respiratory specimens during the acute phase of infection. The lowest concentration of SARS-CoV-2 viral copies this assay can detect is 138 copies/mL. A negative result does not preclude SARS-Cov-2 infection and should not be used as the sole basis for treatment or other patient management decisions. A negative result may occur with  improper specimen collection/handling, submission of specimen other than nasopharyngeal swab, presence of viral mutation(s) within the areas targeted by this assay, and inadequate number of viral copies(<138 copies/mL). A negative result must be combined with clinical observations, patient history, and epidemiological information. The expected result is Negative.  Fact Sheet for Patients:  EntrepreneurPulse.com.au  Fact Sheet for Healthcare Providers:  IncredibleEmployment.be  This test is no t yet approved or cleared by the [place] FDA and  has been authorized for detection and/or diagnosis of SARS-CoV-2 by FDA under an Emergency Use Authorization (EUA). This EUA will remain  in effect (meaning this test can be used) for the duration of the COVID-19 declaration under Section 564(b)(1) of the Act, 21 U.S.C.section 360bbb-3(b)(1), unless the authorization is terminated  or revoked sooner.       Influenza A by PCR NEGATIVE NEGATIVE Final   Influenza B by PCR NEGATIVE NEGATIVE Final    Comment: (NOTE) The Xpert Xpress SARS-CoV-2/FLU/RSV plus assay is intended as an aid in the diagnosis of influenza from Nasopharyngeal swab specimens and should not be used as a sole basis for treatment. Nasal washings and aspirates are unacceptable for Xpert Xpress SARS-CoV-2/FLU/RSV testing.  Fact Sheet for Patients: EntrepreneurPulse.com.au  Fact Sheet for Healthcare Providers: IncredibleEmployment.be  This test is not yet approved or  cleared by the [place] FDA and has been authorized for detection and/or diagnosis of SARS-CoV-2 by FDA under an Emergency Use Authorization (EUA). This EUA will remain in effect (meaning this test can be used) for the duration of the COVID-19 declaration under Section 564(b)(1) of the Act, 21 U.S.C. section 360bbb-3(b)(1), unless the authorization is terminated or revoked.  Performed at [facility], [address], [place], [place] [address]      Labs: BNP (last 3 results) No results for input(s): BNP in the last 8760 hours. Basic Metabolic Panel: Recent Labs  Lab 08/31/20 1254 08/31/20 1545 08/31/20 2231 09/01/20 0147 09/02/20 0109  NA 133* 138 136 137 135  K 5.4* 4.3 4.3 4.4 4.2  CL 95* 97* 99 98 101  CO2 20* _0 GLUCOSE 266* 105* 127* 89 137*  BUN 25* 21* _1 CREATININE 1.14 0.97 1.07 0.98 0.83  CALCIUM 9.3 9.2 9.5 9.8 9.1   Liver Function Tests: Recent Labs  Lab 08/31/20 0927 08/31/20 1545 09/01/20 0614 09/02/20 0109  AST 116* 74* 64* 80*  ALT 193* 143* 126* 118*  ALKPHOS 92 71 71 59  BILITOT 1.7* 1.2 1.7* 0.9  PROT 9.2* 7.6 6.9 6.2*  ALBUMIN 5.7* 4.7 3.8 3.5   Recent Labs  Lab 08/31/20 0927  LIPASE <10*   No results for input(s): AMMONIA in the  last 168 hours. CBC: Recent Labs  Lab 08/31/20 0927 08/31/20 1246 09/01/20 0147  WBC 16.1*  --  14.6*  HGB 17.5* 15.6 15.0  HCT 49.9 46.0 44.8  MCV 92.8  --  97.0  PLT 378  --  308   Cardiac Enzymes: Recent Labs  Lab 08/31/20 1417  CKTOTAL 243   BNP: Invalid input(s): POCBNP CBG: No results for input(s): GLUCAP in the last 168 hours. D-Dimer No results for input(s): DDIMER in the last 72 hours. Hgb A1c Recent Labs    08/31/20 0927  HGBA1C 5.1   Lipid Profile No results for input(s): CHOL, HDL, LDLCALC, TRIG, CHOLHDL, LDLDIRECT in the last 72 hours. Thyroid function studies No results for input(s): TSH, T4TOTAL, T3FREE, THYROIDAB in the last 72  hours.  Invalid input(s): FREET3 Anemia work up No results for input(s): VITAMINB12, FOLATE, FERRITIN, TIBC, IRON, RETICCTPCT in the last 72 hours. Urinalysis    Component Value Date/Time   COLORURINE YELLOW 08/31/2020 1022   APPEARANCEUR CLEAR 08/31/2020 1022   LABSPEC 1.030 08/31/2020 1022   PHURINE 5.5 08/31/2020 1022   GLUCOSEU NEGATIVE 08/31/2020 1022   HGBUR NEGATIVE 08/31/2020 1022   [place] 08/31/2020 1022   KETONESUR >80 (A) 08/31/2020 1022   PROTEINUR 100 (A) 08/31/2020 1022   NITRITE NEGATIVE 08/31/2020 1022   LEUKOCYTESUR NEGATIVE 08/31/2020 1022   Sepsis Labs Invalid input(s): PROCALCITONIN,  WBC,  LACTICIDVEN Microbiology Recent Results (from the past 240 hour(s))  SARS CORONAVIRUS 2 (TAT 6-24 HRS) Nasopharyngeal Nasopharyngeal Swab     Status: None   Collection Time: 08/31/20  3:47 PM   Specimen: Nasopharyngeal Swab  Result Value Ref Range Status   SARS Coronavirus 2 NEGATIVE NEGATIVE Final    Comment: (NOTE) SARS-CoV-2 target nucleic acids are NOT DETECTED.  The SARS-CoV-2 RNA is generally detectable in upper and lower respiratory specimens during the acute phase of infection. Negative results do not preclude SARS-CoV-2 infection, do not rule out co-infections with other pathogens, and should not be used as the sole basis for treatment or other patient management decisions. Negative results must be combined with clinical observations, patient history, and epidemiological information. The expected result is Negative.  Fact Sheet for Patients: SugarRoll.be  Fact Sheet for Healthcare Providers: https://www.woods-mathews.com/  This test is not yet approved or cleared by the [place] FDA and  has been authorized for detection and/or diagnosis of SARS-CoV-2 by FDA under an Emergency Use Authorization (EUA). This EUA will remain  in effect (meaning this test can be used) for the duration of  the COVID-19 declaration under Se ction 564(b)(1) of the Act, 21 U.S.C. section 360bbb-3(b)(1), unless the authorization is terminated or revoked sooner.  Performed at [facility], [place] [address] [place], [place] [address]   Resp Panel by RT-PCR (Flu A&B, Covid) Nasopharyngeal Swab     Status: None   Collection Time: 08/31/20  7:26 PM   Specimen: Nasopharyngeal Swab; Nasopharyngeal(NP) swabs in vial transport medium  Result Value Ref Range Status   SARS Coronavirus 2 by RT PCR NEGATIVE NEGATIVE Final    Comment: (NOTE) SARS-CoV-2 target nucleic acids are NOT DETECTED.  The SARS-CoV-2 RNA is generally detectable in upper respiratory specimens during the acute phase of infection. The lowest concentration of SARS-CoV-2 viral copies this assay can detect is 138 copies/mL. A negative result does not preclude SARS-Cov-2 infection and should not be used as the sole basis for treatment or other patient management decisions. A negative result may occur with  improper specimen collection/handling, submission of specimen other than nasopharyngeal swab, presence of viral mutation(s) within the areas targeted by this assay, and inadequate number of viral copies(<138 copies/mL). A negative result must be combined with clinical observations, patient history, and epidemiological information. The expected result is Negative.  Fact Sheet for Patients:  EntrepreneurPulse.com.au  Fact Sheet for Healthcare Providers:  IncredibleEmployment.be  This test is no t yet approved or cleared by the [place] FDA and  has been authorized for detection and/or diagnosis of SARS-CoV-2 by FDA under an Emergency Use Authorization (EUA). This EUA will remain  in effect (meaning this test can be used) for the duration of the COVID-19 declaration under Section 564(b)(1) of the Act, 21 U.S.C.section 360bbb-3(b)(1), unless the authorization is terminated  or revoked  sooner.       Influenza A by PCR NEGATIVE NEGATIVE Final   Influenza B by PCR NEGATIVE NEGATIVE Final    Comment: (NOTE) The Xpert Xpress SARS-CoV-2/FLU/RSV plus assay is intended as an aid in the diagnosis of influenza from Nasopharyngeal swab specimens and should not be used as a sole basis for treatment. Nasal washings and aspirates are unacceptable for Xpert Xpress SARS-CoV-2/FLU/RSV testing.  Fact Sheet for Patients: EntrepreneurPulse.com.au  Fact Sheet for Healthcare Providers: IncredibleEmployment.be  This test is not yet approved or cleared by the [place] FDA and has been authorized for detection and/or diagnosis of SARS-CoV-2 by FDA under an Emergency Use Authorization (EUA). This EUA will remain in effect (meaning this test can be used) for the duration of the COVID-19 declaration under Section 564(b)(1) of the Act, 21 U.S.C. section 360bbb-3(b)(1), unless the authorization is terminated or revoked.  Performed at [facility], [address] [place], [place] [address]    Time spent: 30 min  SIGNED:   Marylu Lund, MD  [facility] 09/02/2020, 5:20 PM  If 7PM-7AM, please contact night-coverage

## 2022-05-12 ENCOUNTER — Ambulatory Visit: Payer: Self-pay

## 2022-05-12 ENCOUNTER — Other Ambulatory Visit: Payer: Self-pay

## 2022-05-12 DIAGNOSIS — Z0000 Encounter for general adult medical examination without abnormal findings: Secondary | ICD-10-CM

## 2022-05-27 IMAGING — US US ABDOMEN LIMITED
1 series · 14 of 25 positions shown · non-contrast
Comparison: None.

CLINICAL DATA: Elevated liver function test. Alcohol use. Substance
use.

EXAM:
ULTRASOUND ABDOMEN LIMITED RIGHT UPPER QUADRANT

[Series 1: us abdomen limited ruq (liver/gb) · 14 of 73 slices shown]
[im 1/73]
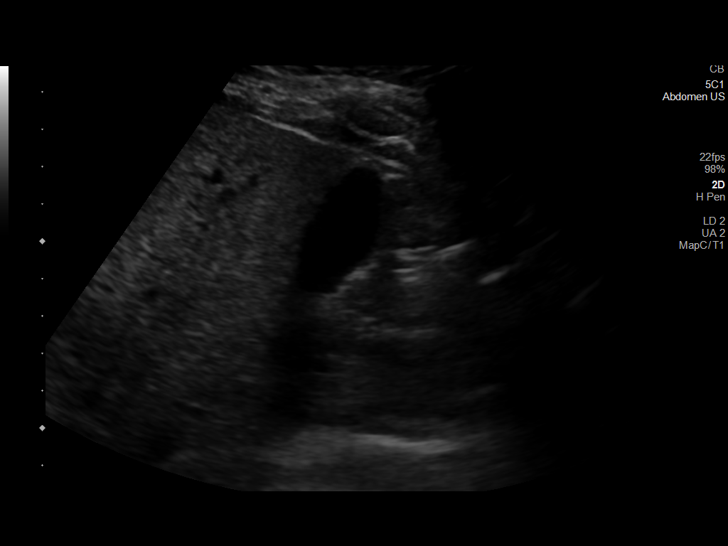
[im 7/73]
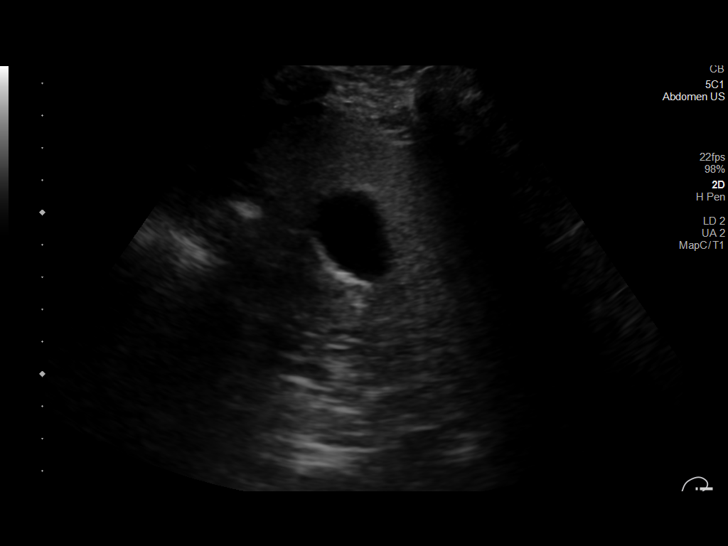
[im 13/73]
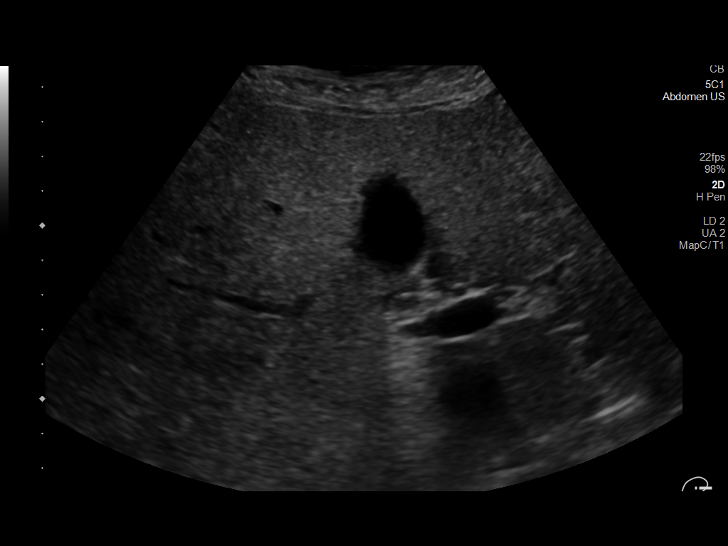
[im 19/73]
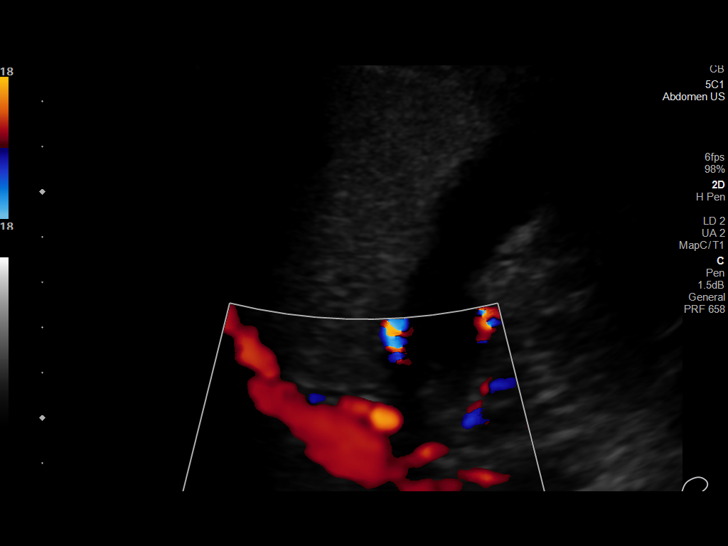
[im 25/73]
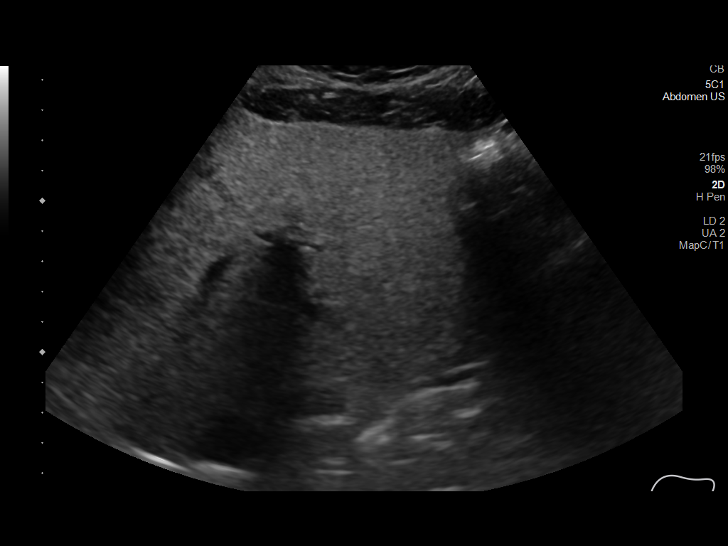
[im 28/73]
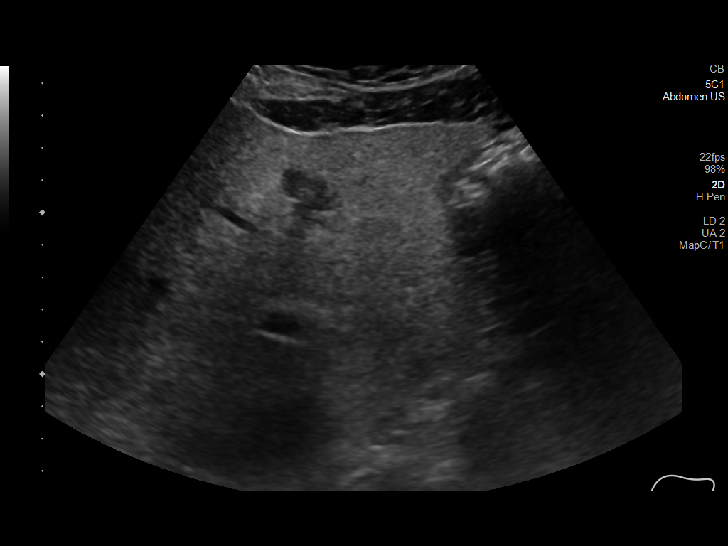
[im 34/73]
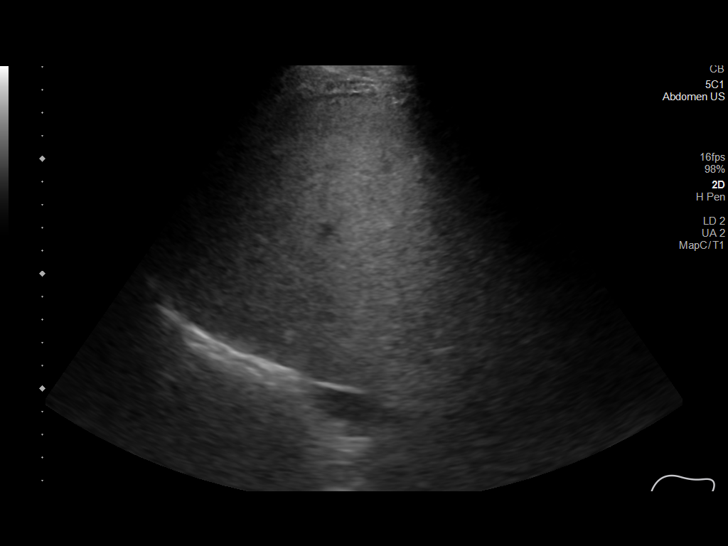
[im 40/73]
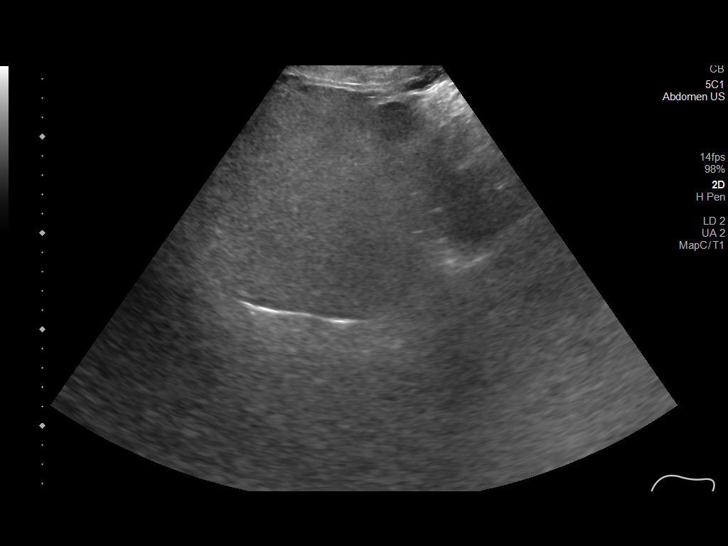
[im 46/73]
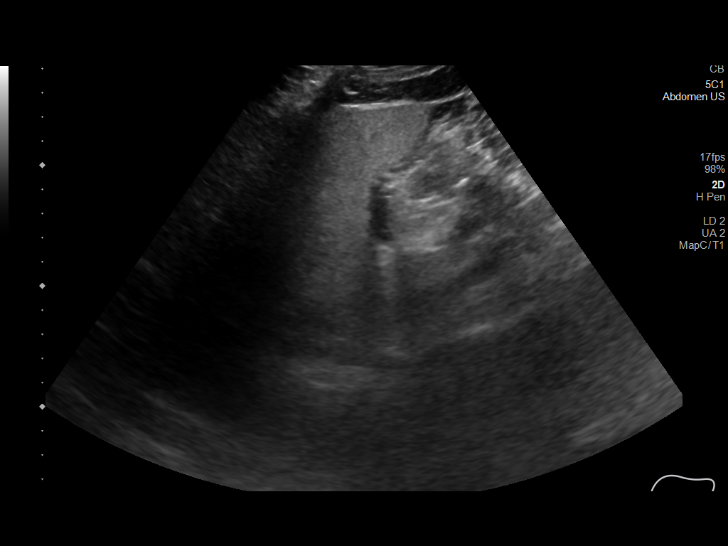
[im 49/73]
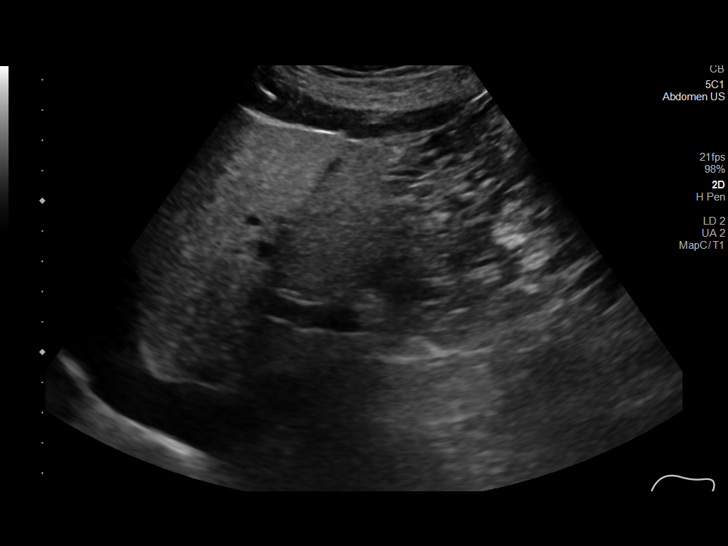
[im 55/73]
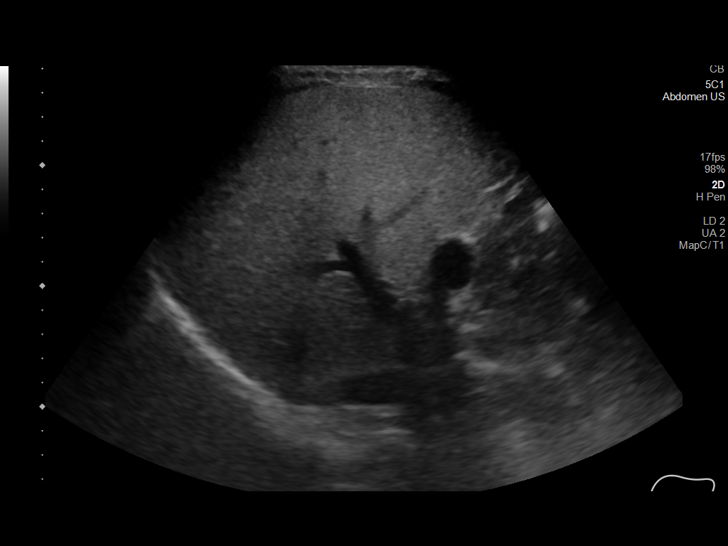
[im 61/73]
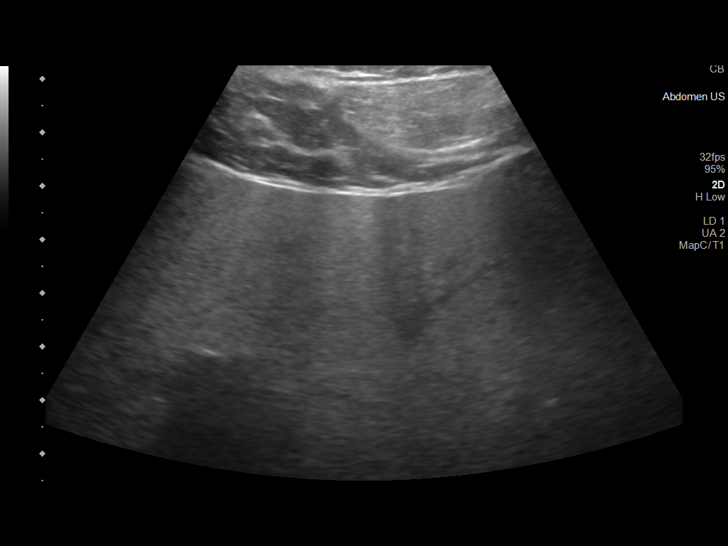
[im 67/73]
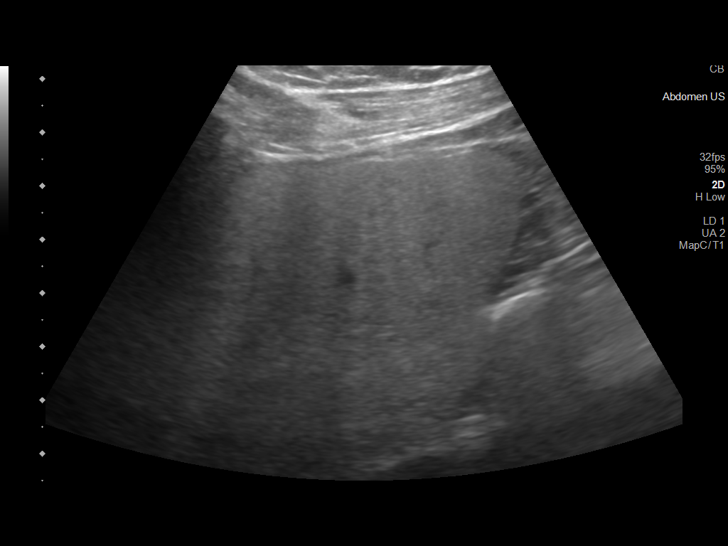
[im 73/73]
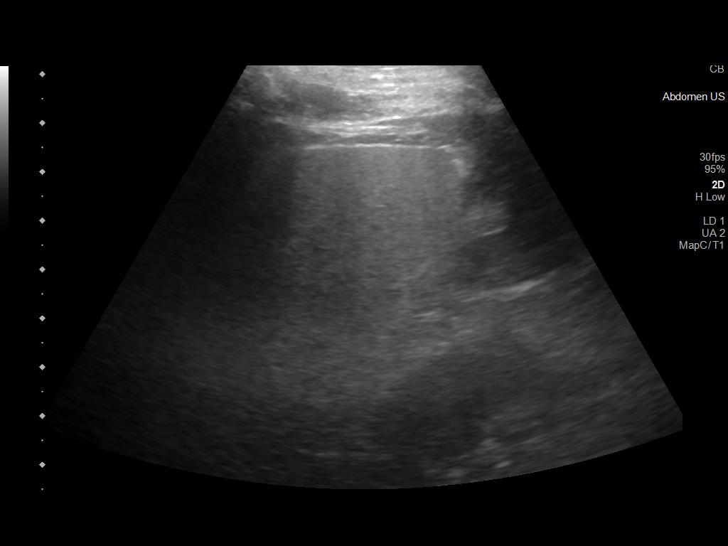

[14 of 25 positions shown; findings below may reference images not displayed]

FINDINGS: Gallbladder:

No gallstones or wall thickening visualized. No sonographic Murphy
sign noted by sonographer.

Common bile duct:

Diameter: 3 mm.

Liver:

Nodular hepatic contour. No focal lesion identified. Increased in
parenchymal echogenicity. Portal vein is patent on color Doppler
imaging with normal direction of blood flow towards the liver.

Other: None.
IMPRESSION: Cirrhosis and hepatic steatosis. Please note limited evaluation for
focal hepatic masses in a patient with hepatic steatosis due to
decreased penetration of the acoustic ultrasound waves. Recommend
MRI liver protocol for further evaluation.
